# Patient Record
Sex: Female | Born: 1935 | Race: White | Hispanic: No
Health system: Southern US, Community
[De-identification: ages and names within clinical notes are randomized; demographics above are authoritative.]

## PROBLEM LIST (undated history)

## (undated) DIAGNOSIS — E7800 Pure hypercholesterolemia, unspecified: Secondary | ICD-10-CM

## (undated) DIAGNOSIS — I10 Essential (primary) hypertension: Secondary | ICD-10-CM

## (undated) DIAGNOSIS — F0390 Unspecified dementia without behavioral disturbance: Secondary | ICD-10-CM

## (undated) HISTORY — PX: KNEE SURGERY: SHX244

---

## 2004-01-30 ENCOUNTER — Other Ambulatory Visit: Payer: Self-pay

## 2004-02-15 ENCOUNTER — Inpatient Hospital Stay: Payer: Self-pay | Admitting: General Practice

## 2004-02-19 ENCOUNTER — Encounter: Payer: Self-pay | Admitting: Internal Medicine

## 2004-03-01 ENCOUNTER — Encounter: Payer: Self-pay | Admitting: Internal Medicine

## 2004-03-14 ENCOUNTER — Encounter: Payer: Self-pay | Admitting: Internal Medicine

## 2004-11-26 ENCOUNTER — Ambulatory Visit: Payer: Self-pay | Admitting: Internal Medicine

## 2005-11-28 ENCOUNTER — Ambulatory Visit: Payer: Self-pay | Admitting: Internal Medicine

## 2006-06-02 ENCOUNTER — Ambulatory Visit: Payer: Self-pay | Admitting: Internal Medicine

## 2006-12-01 ENCOUNTER — Ambulatory Visit: Payer: Self-pay | Admitting: Internal Medicine

## 2006-12-23 ENCOUNTER — Ambulatory Visit: Payer: Self-pay | Admitting: Orthopedic Surgery

## 2007-04-21 ENCOUNTER — Ambulatory Visit: Payer: Self-pay | Admitting: Internal Medicine

## 2007-05-05 ENCOUNTER — Emergency Department: Payer: Self-pay | Admitting: Emergency Medicine

## 2007-12-03 ENCOUNTER — Ambulatory Visit: Payer: Self-pay | Admitting: Internal Medicine

## 2008-12-13 ENCOUNTER — Ambulatory Visit: Payer: Self-pay | Admitting: Internal Medicine

## 2009-03-17 IMAGING — MG MM CAD SCREENING MAMMO
1 series · 4 of 4 positions shown · non-contrast
Comparison: none

REASON FOR EXAM: scr mammo
COMMENTS:

[Series 795: R CC · right · 4 of 4 slices shown]
[im 1/4]
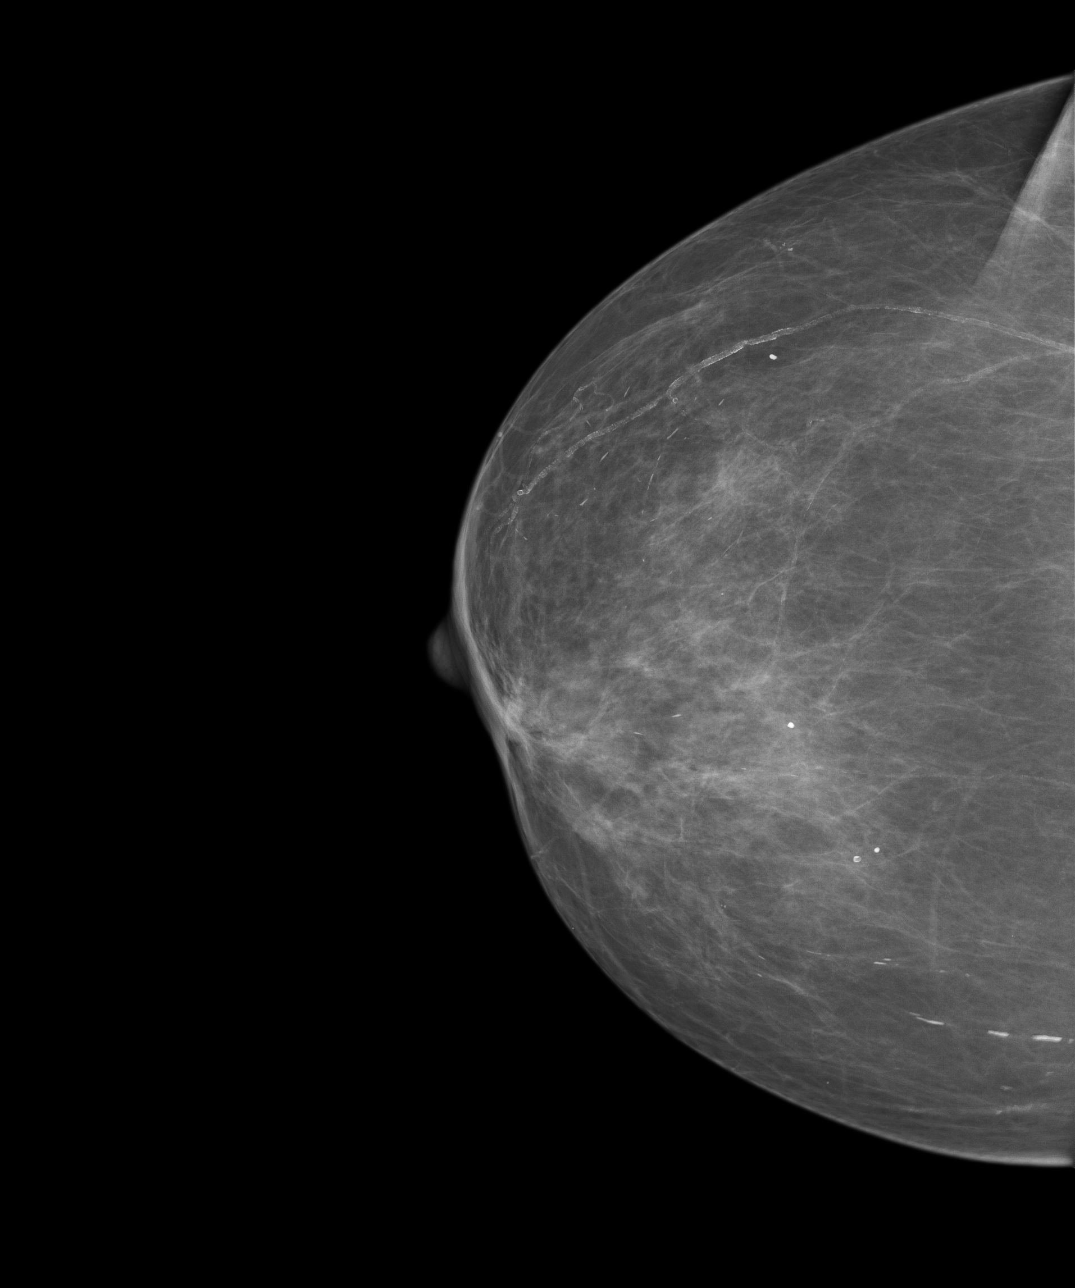
[im 2/4]
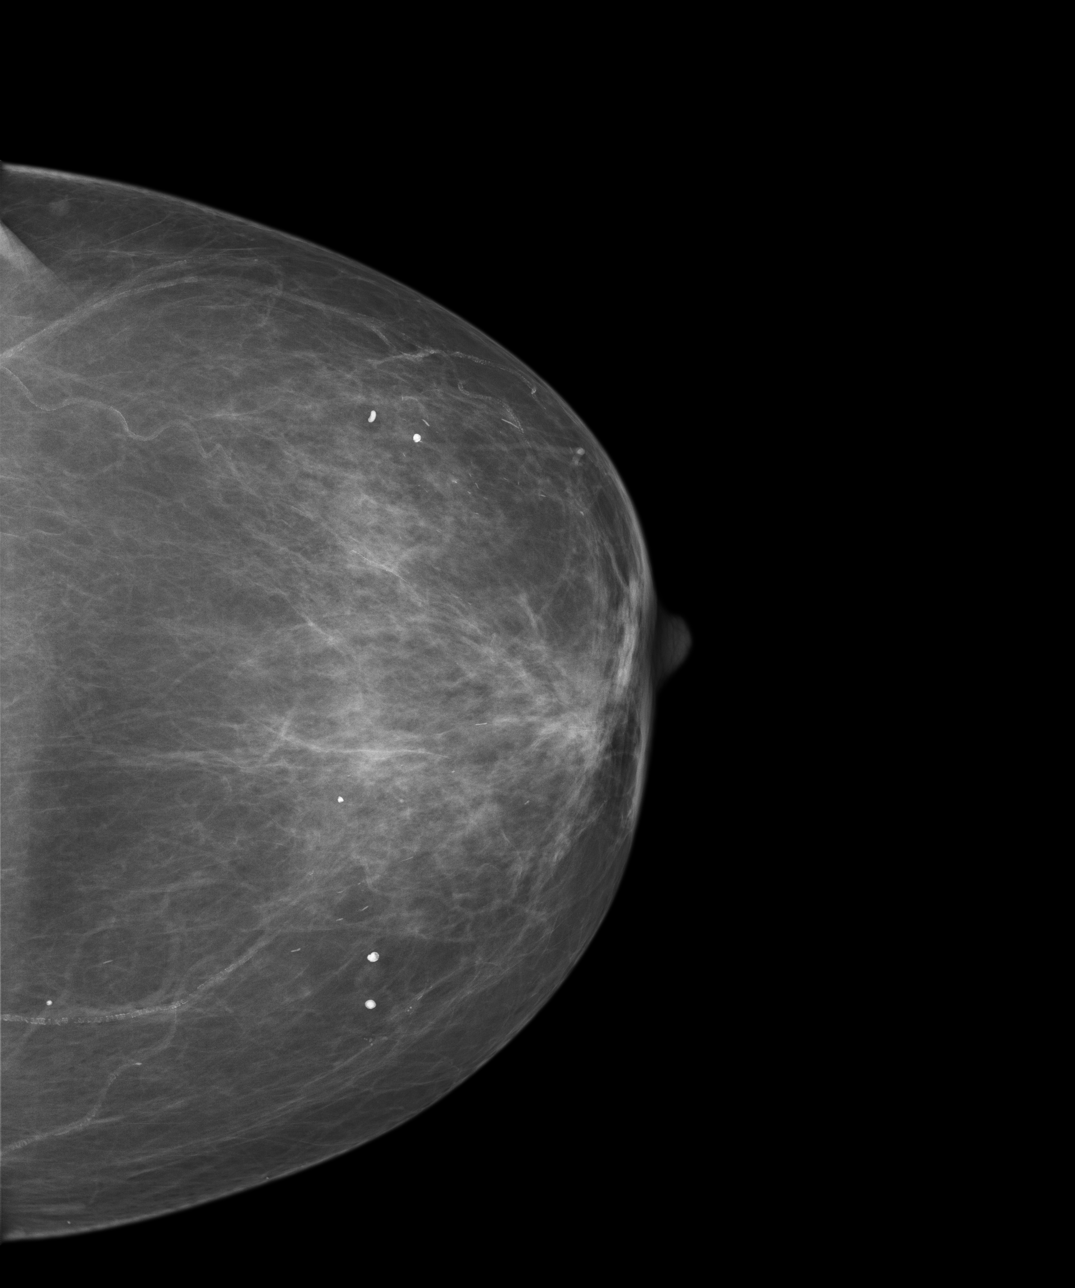
[im 3/4]
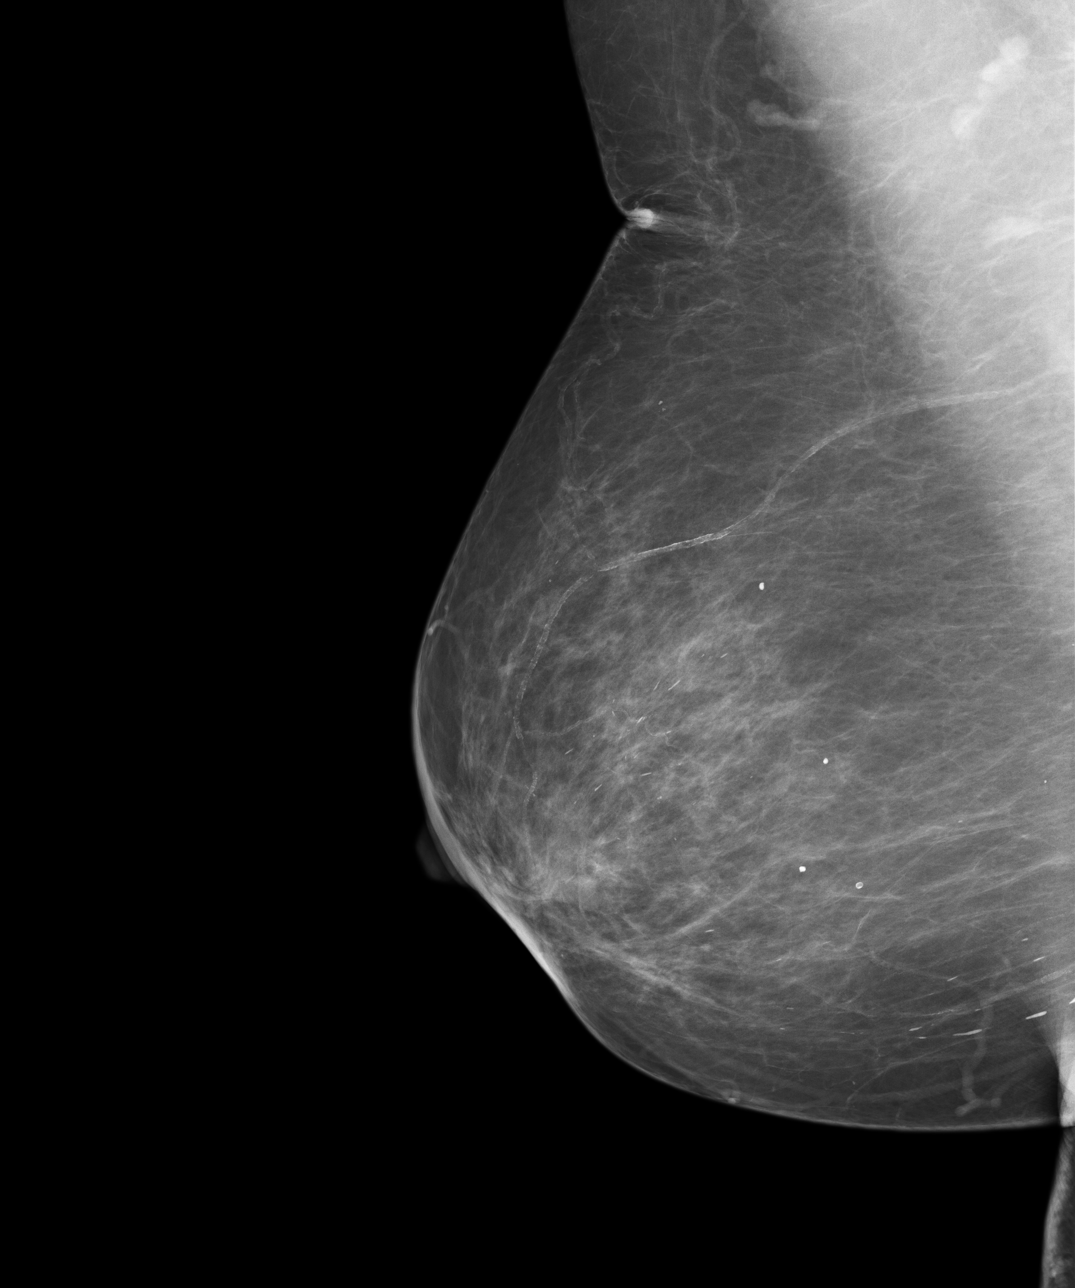
[im 4/4]
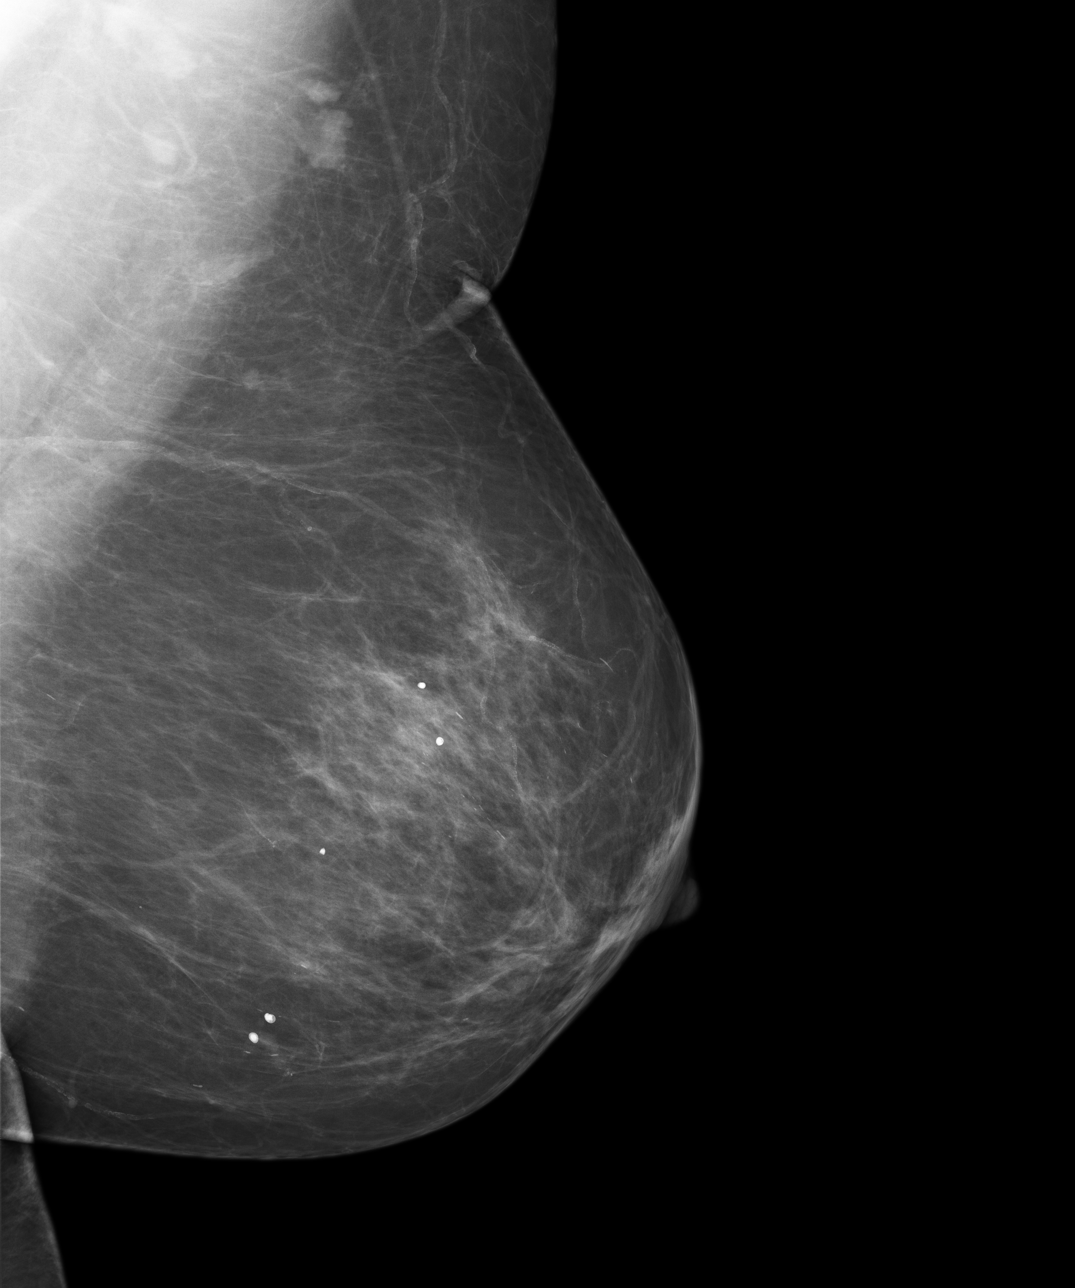

[4 of 4 positions shown; findings below may reference images not displayed]

PROCEDURE:     MAM - MAM DGTL SCREENING MAMMO W/CAD  - December 01, 2006  [DATE]

RESULT:     Comparison is made with film screen images of 10/15/2000 and to
digital images of 11/28/2005, 11/26/2004 and 10/20/2001.  The breasts
exhibit a mildly dense to moderately dense parenchymal pattern with stable
benign-appearing calcifications.  There is no developing density, dominant
mass or malignant appearing calcification present.  The appearance is
stable.
IMPRESSION: Stable, benign-appearing bilateral mammogram.

BI-RADS:  Category 2  Benign Finding.

Please continue to encourage annual mammographic follow up.

A NEGATIVE MAMMOGRAM REPORT DOES NOT PRECLUDE BIOPSY OR OTHER EVALUATION OF
A CLINICALLY PALPABLE OR OTHERWISE SUSPICIOUS MASS OR LESION.  BREAST CANCER
MAY NOT BE DETECTED BY MAMMOGRAPHY IN UP TO 10% OF CASES.

## 2009-12-14 ENCOUNTER — Ambulatory Visit: Payer: Self-pay | Admitting: Internal Medicine

## 2010-12-17 ENCOUNTER — Ambulatory Visit: Payer: Self-pay | Admitting: Internal Medicine

## 2011-12-18 ENCOUNTER — Ambulatory Visit: Payer: Self-pay | Admitting: Internal Medicine

## 2012-12-18 ENCOUNTER — Ambulatory Visit: Payer: Self-pay | Admitting: Internal Medicine

## 2013-07-22 ENCOUNTER — Ambulatory Visit: Payer: Self-pay | Admitting: Podiatry

## 2013-12-21 ENCOUNTER — Ambulatory Visit: Payer: Self-pay | Admitting: Internal Medicine

## 2017-10-15 ENCOUNTER — Emergency Department: Payer: Medicare Other

## 2017-10-15 ENCOUNTER — Encounter: Payer: Self-pay | Admitting: *Deleted

## 2017-10-15 ENCOUNTER — Emergency Department
Admission: EM | Admit: 2017-10-15 | Discharge: 2017-10-16 | Disposition: A | Discharge status: Home / Self Care | Payer: Medicare Other | Attending: Emergency Medicine | Admitting: Emergency Medicine

## 2017-10-15 DIAGNOSIS — I10 Essential (primary) hypertension: Secondary | ICD-10-CM | POA: Diagnosis not present

## 2017-10-15 DIAGNOSIS — S0990XA Unspecified injury of head, initial encounter: Secondary | ICD-10-CM | POA: Diagnosis not present

## 2017-10-15 DIAGNOSIS — Y939 Activity, unspecified: Secondary | ICD-10-CM | POA: Insufficient documentation

## 2017-10-15 DIAGNOSIS — Y92003 Bedroom of unspecified non-institutional (private) residence as the place of occurrence of the external cause: Secondary | ICD-10-CM | POA: Diagnosis not present

## 2017-10-15 DIAGNOSIS — Y999 Unspecified external cause status: Secondary | ICD-10-CM | POA: Diagnosis not present

## 2017-10-15 DIAGNOSIS — F0390 Unspecified dementia without behavioral disturbance: Secondary | ICD-10-CM | POA: Diagnosis not present

## 2017-10-15 DIAGNOSIS — W19XXXA Unspecified fall, initial encounter: Secondary | ICD-10-CM

## 2017-10-15 DIAGNOSIS — W228XXA Striking against or struck by other objects, initial encounter: Secondary | ICD-10-CM | POA: Insufficient documentation

## 2017-10-15 HISTORY — DX: Pure hypercholesterolemia, unspecified: E78.00

## 2017-10-15 HISTORY — DX: Unspecified dementia, unspecified severity, without behavioral disturbance, psychotic disturbance, mood disturbance, and anxiety: F03.90

## 2017-10-15 HISTORY — DX: Essential (primary) hypertension: I10

## 2017-10-15 NOTE — Discharge Instructions (Addendum)
Return to the ER for worsening symptoms, persistent vomiting, difficulty breathing, lethargy or other concerns 

## 2017-10-15 NOTE — ED Provider Notes (Signed)
[facility] Emergency Department Provider Note   ____________________________________________   First MD Initiated Contact with Patient 10/15/17 2309     (approximate)  I have reviewed the triage vital signs and the nursing notes.   HISTORY  Chief Complaint Fall  Level 5 caveat: History limited by dementia Majority of history obtained via patient's family members  HPI Miranda Mckenzie is a 84 y.o. female who presents to the ED from home status post mechanical fall.  Patient has a sitter who stays with her who states patient rolled over off the bed and onto the floor, striking her head on the nightstand.  Patient denies LOC.  Voices no medical complaints.  Takes a baby aspirin daily.  Denies headache, neck pain, vision changes, chest pain, shortness of breath, abdominal pain, nausea or vomiting.   Past Medical History:  Diagnosis Date  . Dementia   . High cholesterol   . Hypertension     There are no active problems to display for this patient.   Past Surgical History:  Procedure Laterality Date  . KNEE SURGERY      Prior to Admission medications   Not on File    Allergies Patient has no allergy information on record.  No family history on file.  Social History Social History   Tobacco Use  . Smoking status: Not on file  Substance Use Topics  . Alcohol use: Not on file  . Drug use: Not on file    Review of Systems  Constitutional: No fever/chills Eyes: No visual changes. ENT: No sore throat. Cardiovascular: Denies chest pain. Respiratory: Denies shortness of breath. Gastrointestinal: No abdominal pain.  No nausea, no vomiting.  No diarrhea.  No constipation. Genitourinary: Negative for dysuria. Musculoskeletal: Negative for back pain. Skin: Negative for rash. Neurological: Positive for minor head injury.  Negative for headaches, focal weakness or numbness.   ____________________________________________   PHYSICAL  EXAM:  VITAL SIGNS: ED Triage Vitals [10/15/17 2244]  Enc Vitals Group     BP (!) 183/83     Pulse Rate 62     Resp 16     Temp 98.9 F (37.2 C)     Temp src      SpO2 96 %     Weight      Height      Head Circumference      Peak Flow      Pain Score      Pain Loc      Pain Edu?      Excl. in GC?     Constitutional: Alert and oriented. Well appearing and in no acute distress. Eyes: Conjunctivae are normal. PERRL. EOMI. Head: Small posterior hematoma without active bleeding or laceration. Nose: No external evidence of injury. Mouth/Throat: Mucous membranes are moist.  No dental malocclusion. Neck: No stridor.  No cervical spine tenderness to palpation. Cardiovascular: Normal rate, regular rhythm. Grossly normal heart sounds.  Good peripheral circulation. Respiratory: Normal respiratory effort.  No retractions. Lungs CTAB. Gastrointestinal: Soft and nontender. No distention. No abdominal bruits. No CVA tenderness. Musculoskeletal: Pelvis stable.  No lower extremity tenderness nor edema.  No joint effusions. Neurologic: Alert and oriented x2 which is baseline for patient per family members.  Normal speech and language. No gross focal neurologic deficits are appreciated. MAEx4. Skin:  Skin is warm, dry and intact. No rash noted. Psychiatric: Mood and affect are normal. Speech and behavior are normal.  ____________________________________________   LABS (all labs ordered are  listed, but only abnormal results are displayed)  Labs Reviewed - No data to display ____________________________________________  EKG  None ____________________________________________  RADIOLOGY  ED MD interpretation: No ICH  Official radiology report(s): Ct Head Wo Contrast  Result Date: 10/15/2017 CLINICAL DATA:  Patient rule out of bed tonight and struck head on the night stand. No loss of consciousness. EXAM: CT HEAD WITHOUT CONTRAST TECHNIQUE: Contiguous axial images were obtained from the  base of the skull through the vertex without intravenous contrast. COMPARISON:  None. FINDINGS: Brain: Diffuse cerebral atrophy. Mild ventricular dilatation consistent with central atrophy. Low-attenuation changes in the deep white matter consistent with small vessel ischemia. No mass-effect or midline shift. No abnormal extra-axial fluid collections. Gray-white matter junctions are distinct. Basal cisterns are not effaced. No acute intracranial hemorrhage. Vascular: Moderate intracranial arterial vascular calcifications are present. Dilated and tortuous basilar artery. Skull: Calvarium appears intact. No acute depressed skull fractures. Sinuses/Orbits: Paranasal sinuses and mastoid air cells are clear. Mastoids are hypoaerated. Other: Suggestion of a small subcutaneous scalp laceration over the right anterior frontal region. IMPRESSION: No acute intracranial abnormalities. Chronic atrophy and small vessel ischemic changes. Electronically Signed   By: Burman Nieves M.D.   On: 10/15/2017 23:52    ____________________________________________   PROCEDURES  Procedure(s) performed: None  Procedures  Critical Care performed: No  ____________________________________________   INITIAL IMPRESSION / ASSESSMENT AND PLAN / ED COURSE  As part of my medical decision making, I reviewed the following data within the electronic MEDICAL RECORD NUMBER History obtained from family, Nursing notes reviewed and incorporated, Old chart reviewed, Radiograph reviewed and Notes from prior ED visits   84 year old female who presents status post mechanical fall with minor head injury.  Differential diagnosis includes but is not limited to ICH, SDH, cerebral contusion, concussion, etc.  Patient is sitting in no acute distress visiting with family members.  Will obtain CT head to evaluate for intracranial injury.  Clinical Course as of Oct 16 2354  Wed Oct 15, 2017  2356 Updated patient and family members of negative CT  head.  Strict return precautions given.  All verbalize understanding and agree with plan of care.   [JS]    Clinical Course User Index [JS] Irean Hong, MD     ____________________________________________   FINAL CLINICAL IMPRESSION(S) / ED DIAGNOSES  Final diagnoses:  Fall, initial encounter  Minor head injury, initial encounter     ED Discharge Orders    None       Note:  This document was prepared using Dragon voice recognition software and may include unintentional dictation errors.    Irean Hong, MD 10/16/17 [phone]

## 2017-10-15 NOTE — ED Notes (Addendum)
Resumed care from [facility]  Pt alert. Pt rolled out of bed tonight and hit her head on  the nightstand.  No loc. No vomiting. Speech clear.  Denies neck or back pain.

## 2017-10-15 NOTE — ED Triage Notes (Signed)
Pt thinks she may have rolled over and off the bed, possibly striking her head on the nightstand as a piece of wood is missing from the corner of the nightstand. She takes ASA everyday. The pt does have dementia and had a sitter staying with her and she heard a thump, she went in the room and the pt was sitting on the floor. Hematoma and abrasion to the back of the head.

## 2019-03-03 ENCOUNTER — Ambulatory Visit: Payer: Medicare Other | Attending: Internal Medicine

## 2019-03-03 DIAGNOSIS — Z23 Encounter for immunization: Secondary | ICD-10-CM | POA: Insufficient documentation

## 2019-03-03 NOTE — Progress Notes (Signed)
   Covid-19 Vaccination Clinic  Name:  Miranda Mckenzie    MRN: [id number] DOB: [birth year]  03/03/2019  [name] was observed post Covid-19 immunization for 15 minutes without incidence. She was provided with Vaccine Information Sheet and instruction to access the V-Safe system.   [name] was instructed to call 911 with any severe reactions post vaccine: Marland Kitchen Difficulty breathing  . Swelling of your face and throat  . A fast heartbeat  . A bad rash all over your body  . Dizziness and weakness    Immunizations Administered    Name Date Dose VIS Date Route   Pfizer COVID-19 Vaccine 03/03/2019  5:14 PM 0.3 mL 01/22/2019 Intramuscular   Manufacturer: [employer], [employer]   Lot: [id number]   NDC: [address]-9

## 2019-03-22 ENCOUNTER — Ambulatory Visit: Payer: Medicare Other | Attending: Internal Medicine

## 2019-03-22 DIAGNOSIS — Z23 Encounter for immunization: Secondary | ICD-10-CM

## 2019-03-22 NOTE — Progress Notes (Signed)
   Covid-19 Vaccination Clinic  Name:  Miranda Mckenzie    MRN: [id number] DOB: 1922-03-24  03/22/2019  [name] was observed post Covid-19 immunization for 15 minutes without incidence. She was provided with Vaccine Information Sheet and instruction to access the V-Safe system.   [name] was instructed to call 911 with any severe reactions post vaccine: Marland Kitchen Difficulty breathing  . Swelling of your face and throat  . A fast heartbeat  . A bad rash all over your body  . Dizziness and weakness    Immunizations Administered    Name Date Dose VIS Date Route   Pfizer COVID-19 Vaccine 03/22/2019 11:23 AM 0.3 mL 01/22/2019 Intramuscular   Manufacturer: [employer], [employer]   Lot: [id number]   NDC: [address]-4

## 2020-01-28 ENCOUNTER — Ambulatory Visit: Payer: Medicare Other | Attending: Internal Medicine

## 2020-01-28 ENCOUNTER — Other Ambulatory Visit: Payer: Self-pay | Admitting: Internal Medicine

## 2020-01-28 DIAGNOSIS — Z23 Encounter for immunization: Secondary | ICD-10-CM

## 2020-01-28 NOTE — Progress Notes (Signed)
   Covid-19 Vaccination Clinic  Name:  Miranda Mckenzie    MRN: [id number] DOB: 02/04/1923  01/28/2020  Miranda Mckenzie was observed post Covid-19 immunization for 15 minutes without incident. She was provided with Vaccine Information Sheet and instruction to access the V-Safe system.   Miranda Mckenzie was instructed to call 911 with any severe reactions post vaccine: Marland Kitchen Difficulty breathing  . Swelling of face and throat  . A fast heartbeat  . A bad rash all over body  . Dizziness and weakness   Immunizations Administered    Name Date Dose VIS Date Route   Pfizer COVID-19 Vaccine 01/28/2020 10:09 AM 0.3 mL 12/01/2019 Intramuscular   Manufacturer: [employer], [employer]   Lot: [id number]   NDC: [address]-7

## 2020-03-31 ENCOUNTER — Other Ambulatory Visit: Payer: Self-pay | Admitting: Internal Medicine

## 2020-03-31 DIAGNOSIS — R400 Somnolence: Secondary | ICD-10-CM

## 2020-03-31 DIAGNOSIS — R4182 Altered mental status, unspecified: Secondary | ICD-10-CM

## 2020-04-12 ENCOUNTER — Other Ambulatory Visit: Payer: Self-pay

## 2020-04-12 ENCOUNTER — Ambulatory Visit
Admission: RE | Admit: 2020-04-12 | Discharge: 2020-04-12 | Disposition: A | Discharge status: Home / Self Care | Payer: Medicare Other | Source: Ambulatory Visit | Attending: Internal Medicine | Admitting: Internal Medicine

## 2020-04-12 DIAGNOSIS — R4182 Altered mental status, unspecified: Secondary | ICD-10-CM | POA: Insufficient documentation

## 2020-04-12 DIAGNOSIS — R400 Somnolence: Secondary | ICD-10-CM | POA: Diagnosis present

## 2020-08-18 ENCOUNTER — Ambulatory Visit: Payer: Medicare Other

## 2020-10-06 ENCOUNTER — Emergency Department
Admission: EM | Admit: 2020-10-06 | Discharge: 2020-10-12 | Disposition: E | Payer: Medicare Other | Attending: Emergency Medicine | Admitting: Emergency Medicine

## 2020-10-06 ENCOUNTER — Emergency Department: Payer: Medicare Other

## 2020-10-06 DIAGNOSIS — R092 Respiratory arrest: Secondary | ICD-10-CM | POA: Diagnosis present

## 2020-10-06 DIAGNOSIS — I10 Essential (primary) hypertension: Secondary | ICD-10-CM | POA: Insufficient documentation

## 2020-10-06 DIAGNOSIS — Z20822 Contact with and (suspected) exposure to covid-19: Secondary | ICD-10-CM | POA: Diagnosis not present

## 2020-10-06 DIAGNOSIS — R790 Abnormal level of blood mineral: Secondary | ICD-10-CM | POA: Diagnosis not present

## 2020-10-06 DIAGNOSIS — Z79899 Other long term (current) drug therapy: Secondary | ICD-10-CM | POA: Diagnosis not present

## 2020-10-06 DIAGNOSIS — F0390 Unspecified dementia without behavioral disturbance: Secondary | ICD-10-CM | POA: Diagnosis not present

## 2020-10-06 DIAGNOSIS — R0689 Other abnormalities of breathing: Secondary | ICD-10-CM | POA: Insufficient documentation

## 2020-10-06 DIAGNOSIS — I469 Cardiac arrest, cause unspecified: Secondary | ICD-10-CM

## 2020-10-06 LAB — CBC WITH DIFFERENTIAL/PLATELET
Abs Immature Granulocytes: 0.38 10*3/uL — ABNORMAL HIGH (ref 0.00–0.07)
Basophils Absolute: 0 10*3/uL (ref 0.0–0.1)
Basophils Relative: 0 %
Eosinophils Absolute: 0.1 10*3/uL (ref 0.0–0.5)
Eosinophils Relative: 1 %
HCT: 41.7 % (ref 36.0–46.0)
Hemoglobin: 12.2 g/dL (ref 12.0–15.0)
Immature Granulocytes: 5 %
Lymphocytes Relative: 50 %
Lymphs Abs: 4.3 10*3/uL — ABNORMAL HIGH (ref 0.7–4.0)
MCH: 28.6 pg (ref 26.0–34.0)
MCHC: 29.3 g/dL — ABNORMAL LOW (ref 30.0–36.0)
MCV: 97.9 fL (ref 80.0–100.0)
Monocytes Absolute: 0.4 10*3/uL (ref 0.1–1.0)
Monocytes Relative: 4 %
Neutro Abs: 3.4 10*3/uL (ref 1.7–7.7)
Neutrophils Relative %: 40 %
Platelets: 187 10*3/uL (ref 150–400)
RBC: 4.26 MIL/uL (ref 3.87–5.11)
RDW: 14.4 % (ref 11.5–15.5)
WBC: 8.5 10*3/uL (ref 4.0–10.5)
nRBC: 0 % (ref 0.0–0.2)

## 2020-10-06 LAB — BRAIN NATRIURETIC PEPTIDE: B Natriuretic Peptide: 258.8 pg/mL — ABNORMAL HIGH (ref 0.0–100.0)

## 2020-10-06 LAB — URINALYSIS, COMPLETE (UACMP) WITH MICROSCOPIC
Bilirubin Urine: NEGATIVE
Glucose, UA: NEGATIVE mg/dL
Hgb urine dipstick: NEGATIVE
Ketones, ur: NEGATIVE mg/dL
Leukocytes,Ua: NEGATIVE
Nitrite: NEGATIVE
Protein, ur: NEGATIVE mg/dL
Specific Gravity, Urine: 1.017 (ref 1.005–1.030)
pH: 5 (ref 5.0–8.0)

## 2020-10-06 LAB — COMPREHENSIVE METABOLIC PANEL
ALT: 21 U/L (ref 0–44)
AST: 41 U/L (ref 15–41)
Albumin: 2.8 g/dL — ABNORMAL LOW (ref 3.5–5.0)
Alkaline Phosphatase: 70 U/L (ref 38–126)
Anion gap: 10 (ref 5–15)
BUN: 15 mg/dL (ref 8–23)
CO2: 30 mmol/L (ref 22–32)
Calcium: 9.1 mg/dL (ref 8.9–10.3)
Chloride: 97 mmol/L — ABNORMAL LOW (ref 98–111)
Creatinine, Ser: 1.02 mg/dL — ABNORMAL HIGH (ref 0.44–1.00)
GFR, Estimated: 51 mL/min — ABNORMAL LOW (ref >60)
Glucose, Bld: 264 mg/dL — ABNORMAL HIGH (ref 70–99)
Potassium: 5.3 mmol/L — ABNORMAL HIGH (ref 3.5–5.1)
Sodium: 137 mmol/L (ref 135–145)
Total Bilirubin: 0.6 mg/dL (ref 0.3–1.2)
Total Protein: 5.9 g/dL — ABNORMAL LOW (ref 6.5–8.1)

## 2020-10-06 LAB — URINE DRUG SCREEN, QUALITATIVE (ARMC ONLY)
Amphetamines, Ur Screen: NOT DETECTED
Barbiturates, Ur Screen: NOT DETECTED
Benzodiazepine, Ur Scrn: NOT DETECTED
Cannabinoid 50 Ng, Ur ~~LOC~~: NOT DETECTED
Cocaine Metabolite,Ur ~~LOC~~: NOT DETECTED
MDMA (Ecstasy)Ur Screen: NOT DETECTED
Methadone Scn, Ur: NOT DETECTED
Opiate, Ur Screen: NOT DETECTED
Phencyclidine (PCP) Ur S: NOT DETECTED
Tricyclic, Ur Screen: NOT DETECTED

## 2020-10-06 LAB — TROPONIN I (HIGH SENSITIVITY): Troponin I (High Sensitivity): 33 ng/L — ABNORMAL HIGH (ref <18)

## 2020-10-06 LAB — RESP PANEL BY RT-PCR (FLU A&B, COVID) ARPGX2
Influenza A by PCR: NEGATIVE
Influenza B by PCR: NEGATIVE
SARS Coronavirus 2 by RT PCR: NEGATIVE

## 2020-10-06 LAB — LACTIC ACID, PLASMA: Lactic Acid, Venous: 7.2 mmol/L (ref 0.5–1.9)

## 2020-10-06 MED ORDER — FENTANYL CITRATE PF 50 MCG/ML IJ SOSY
50.0000 ug | PREFILLED_SYRINGE | INTRAMUSCULAR | Status: DC | PRN
  Administered 2020-10-06: 50 ug via INTRAVENOUS
  Filled 2020-10-06: qty 1

## 2020-10-06 MED ORDER — ONDANSETRON 4 MG PO TBDP
4.0000 mg | ORAL_TABLET | Freq: Four times a day (QID) | ORAL | Status: DC | PRN
Start: 2020-10-06 — End: 2020-10-06

## 2020-10-06 MED ORDER — MIDAZOLAM HCL 2 MG/2ML IJ SOLN
2.0000 mg | Freq: Once | INTRAMUSCULAR | Status: AC
  Administered 2020-10-06: 2 mg via INTRAVENOUS

## 2020-10-06 MED ORDER — EPINEPHRINE PF 1 MG/ML IJ SOLN
1.0000 mg | Freq: Once | INTRAMUSCULAR | Status: AC
  Administered 2020-10-06: 1 mg via INTRAVENOUS

## 2020-10-06 MED ORDER — MIDAZOLAM HCL 2 MG/2ML IJ SOLN
2.0000 mg | INTRAMUSCULAR | Status: DC | PRN
  Administered 2020-10-06: 2 mg via INTRAVENOUS

## 2020-10-06 MED ORDER — LORAZEPAM 2 MG/ML IJ SOLN: 2.0000 mg | INTRAMUSCULAR | Status: DC | PRN

## 2020-10-06 MED ORDER — ONDANSETRON HCL 4 MG/2ML IJ SOLN
4.0000 mg | Freq: Four times a day (QID) | INTRAMUSCULAR | Status: DC | PRN
  Administered 2020-10-06: 4 mg via INTRAVENOUS
  Filled 2020-10-06: qty 2

## 2020-10-06 MED ORDER — MIDAZOLAM-SODIUM CHLORIDE 100-0.9 MG/100ML-% IV SOLN
0.5000 mg/h | INTRAVENOUS | Status: DC
  Administered 2020-10-06: 0.5 mg/h via INTRAVENOUS
  Filled 2020-10-06: qty 100

## 2020-10-06 MED ORDER — CALCIUM CHLORIDE 10 % IV SOLN
INTRAVENOUS | Status: AC | PRN
Start: 2020-10-06 — End: 2020-10-06
  Administered 2020-10-06: 1 g via INTRAVENOUS

## 2020-10-06 MED ORDER — SODIUM CHLORIDE 0.9 % IV SOLN
INTRAVENOUS | Status: AC | PRN
  Administered 2020-10-06: 1000 mL via INTRAVENOUS

## 2020-10-06 NOTE — Code Documentation (Signed)
Miranda Mckenzie and Miranda Mckenzie at bedside

## 2020-10-06 NOTE — Code Documentation (Signed)
Family at beside. Family given emotional support. 

## 2020-10-06 NOTE — ED Notes (Signed)
Respiratory to bedside.

## 2020-10-06 NOTE — Progress Notes (Signed)
Patient terminally extubated per MD order. MD, RN and Family at bedside.

## 2020-10-06 NOTE — Code Documentation (Signed)
ED md left room to speak with family

## 2020-10-06 NOTE — ED Notes (Signed)
MD remains at bedside, discussing with pt's friends plan of care.

## 2020-10-06 NOTE — ED Notes (Signed)
Critical Lactic Acid 7.2 Dr. Marisa Severin notified.

## 2020-10-06 NOTE — Code Documentation (Signed)
Rad at bedside.

## 2020-10-06 NOTE — ED Notes (Signed)
This RN, Consulting civil engineer, Dr. Marisa Severin and RT at  Bedside for extubation. Pt's friends remain at bedside. NAD.

## 2020-10-06 NOTE — ED Notes (Signed)
ED Provider at bedside. 

## 2020-10-06 NOTE — Code Documentation (Signed)
Cut white t shirt, yellow ring with clear and blue stone, blue shoes, cut jeans in pt. Belonging bag. Will be sent home with family.

## 2020-10-06 NOTE — Code Documentation (Signed)
ED MD returns to room, states pt's family does not wish for further resucitaion/CPR measures. Will continue to monitor pt.

## 2020-10-06 NOTE — ED Notes (Signed)
ED Provider at bedside. Extensive conversation on end of life decisions. Pt's friends and healthcare power of attorny verbalized to MD that they would like pt. terminally extubated, with medications control pain. MD discussed pklan of care going forward

## 2020-10-06 NOTE — ED Notes (Signed)
Pt's friends verbalize desire to stay at bedside for terminal extubation.

## 2020-10-06 NOTE — ED Notes (Signed)
ED Provider at bedside. Updated pt's friends on POC.

## 2020-10-06 NOTE — ED Notes (Signed)
ED Provider at bedside, counseling family through pt's last moments, and reassuring family.

## 2020-10-06 NOTE — ED Notes (Signed)
MD notified pt's BP steadily trending downward.

## 2020-10-06 NOTE — ED Provider Notes (Signed)
[facility] Emergency Department Provider Note ____________________________________________   Event Date/Time   First MD Initiated Contact with Patient 10-28-2020 2054     (approximate)  I have reviewed the triage vital signs and the nursing notes.   HISTORY  Chief Complaint Cardiac Arrest (Pt. To ED vias EMS with CPR in progress. Medics report witnessed cardiac arrest at residence, reports asystole on scene, CPR initiated, 2 rounds of epi given, which lead to ROSC, with positive pulse. Medic reports pt. Was pulseless several minutes prior to arrival to ED, and CPR was initiated again.. EMS reports no history other than HTN, and states pt. Has been down a total of 25 mins upon arrival.)  Level 5 caveat: History of present illness limited due to cardiac arrest, unresponsive  HPI Miranda Mckenzie is a 87 y.o. female with PMH as noted below who presents after cardiac arrest.  Per EMS, the patient had a witnessed arrest when she was with friends and suddenly collapsed.  She was in asystole when they arrived.  CPR was initiated and the patient received two doses of epinephrine and then had ROSC.  Pulses were lost again a few minutes from the ED and CPR was initiated again.    Past Medical History:  Diagnosis Date   Dementia (HCC)    High cholesterol    Hypertension     There are no problems to display for this patient.   Past Surgical History:  Procedure Laterality Date   KNEE SURGERY      Prior to Admission medications   Medication Sig Start Date End Date Taking? Authorizing Provider  COVID-19 mRNA vaccine, Pfizer, 30 MCG/0.3ML injection USE AS DIRECTED 01/28/20 01/27/21  Judyann Munson, MD    Allergies Patient has no allergy information on record.  No family history on file.  Social History    Review of Systems Level 5 caveat: unable to obtain review of systems due to cardiac arrest,  unresponsive   ____________________________________________   PHYSICAL EXAM:  VITAL SIGNS: ED Triage Vitals  Enc Vitals Group     BP Oct 28, 2020 2029 (!) 175/95     Pulse Rate 28-Oct-2020 2021 (!) 107     Resp 2020-10-28 2024 (!) 8     Temp 10-28-20 2045 (!) 96.3 F (35.7 C)     Temp Source 2020/10/28 2045 Bladder     SpO2 Oct 28, 2020 2029 100 %     Weight --      Height --      Head Circumference --      Peak Flow --      Pain Score --      Pain Loc --      Pain Edu? --      Excl. in GC? --     Constitutional: Unresponsive Eyes: Pupils 68mm and fixed.  Head: Atraumatic. Nose: No congestion/rhinnorhea. Mouth/Throat: Mucous membranes are moist.   Neck: Normal range of motion.  Cardiovascular: Cardiac arrest, no pulse Respiratory: No spontaneous respirations. Gastrointestinal: No distention.  Genitourinary: Normal external genitalia Musculoskeletal: No lower extremity edema.   Neurologic:  Unresponsive Skin:  Skin is warm and dry. No rash noted. Psychiatric: Unresponsive  ____________________________________________   LABS (all labs ordered are listed, but only abnormal results are displayed)  Labs Reviewed  COMPREHENSIVE METABOLIC PANEL - Abnormal; Notable for the following components:      Result Value   Potassium 5.3 (*)    Chloride 97 (*)    Glucose, Bld 264 (*)  Creatinine, Ser 1.02 (*)    Total Protein 5.9 (*)    Albumin 2.8 (*)    GFR, Estimated 51 (*)    All other components within normal limits  CBC WITH DIFFERENTIAL/PLATELET - Abnormal; Notable for the following components:   MCHC 29.3 (*)    Lymphs Abs 4.3 (*)    Abs Immature Granulocytes 0.38 (*)    All other components within normal limits  LACTIC ACID, PLASMA - Abnormal; Notable for the following components:   Lactic Acid, Venous 7.2 (*)    All other components within normal limits  BRAIN NATRIURETIC PEPTIDE - Abnormal; Notable for the following components:   B Natriuretic Peptide 258.8 (*)    All  other components within normal limits  URINALYSIS, COMPLETE (UACMP) WITH MICROSCOPIC - Abnormal; Notable for the following components:   Color, Urine YELLOW (*)    APPearance HAZY (*)    Bacteria, UA RARE (*)    All other components within normal limits  TROPONIN I (HIGH SENSITIVITY) - Abnormal; Notable for the following components:   Troponin I (High Sensitivity) 33 (*)    All other components within normal limits  RESP PANEL BY RT-PCR (FLU A&B, COVID) ARPGX2  URINE DRUG SCREEN, QUALITATIVE ([facility] ONLY)  LACTIC ACID, PLASMA  TROPONIN I (HIGH SENSITIVITY)   ____________________________________________  EKG  ED ECG REPORT I, Dionne Bucy, the attending physician, personally viewed and interpreted this ECG.  Date: 09/19/2020 EKG Time: 2035 Rate: 52 Rhythm: sinus rhythm with PACs QRS Axis: normal Intervals: normal ST/T Wave abnormalities: normal Narrative Interpretation: no evidence of acute ischemia  ____________________________________________  RADIOLOGY  Chest X-ray interpreted by me shows faint right lung opacities; ET tube 4cm above carina  ____________________________________________   PROCEDURES  Procedure(s) performed: Yes  Procedure Name: Intubation Date/Time:  12:14 AM Performed by: Dionne Bucy, MD Pre-anesthesia Checklist: Patient identified Oxygen Delivery Method: Ambu bag Preoxygenation: Pre-oxygenation with 100% oxygen Induction Type: Rapid sequence Laryngoscope Size: Glidescope and 3 Tube size: 7.5 mm Number of attempts: 1 Secured at: 22 cm Tube secured with: ETT holder     Critical Care performed: Yes  CRITICAL CARE Performed by: Dionne Bucy   Total critical care time: 75 minutes  Critical care time was exclusive of separately billable procedures and treating other patients.  Critical care was necessary to treat or prevent imminent or life-threatening deterioration.  Critical care was time spent personally  by me on the following activities: development of treatment plan with patient and/or surrogate as well as nursing, discussions with consultants, evaluation of patient's response to treatment, examination of patient, obtaining history from patient or surrogate, ordering and performing treatments and interventions, ordering and review of laboratory studies, ordering and review of radiographic studies, pulse oximetry and re-evaluation of patient's condition.  ____________________________________________   INITIAL IMPRESSION / ASSESSMENT AND PLAN / ED COURSE  Pertinent labs & imaging results that were available during my care of the patient were reviewed by me and considered in my medical decision making (see chart for details).   87 year old female with PMH as noted above, overall very healthy for her age presents in cardiac arrest after collapsing while with friends.  She was in asystole with EMS, had ROSC after epinephrine x2, then lost pulses and arrived in ED with CPR in progress.  EMS gave me a legal form which was a Engineer, materials of a health care proxy to make decisions if the patient was incapacitated but did not specify DNR or other advanced directives.  On ED arrival, CPR was continued.  The patient had no pulse on initial check.  Additional epinephrine was given, the patient was intubated successfully on the first attempt, and then had ROSC again.  After second ROSC, the patient remained somewhat hypotensive.  She was completely unresponsive and did not react to intubation or the mechanical ventilation.    Shortly afterwards the patient's friend and POA, Aurea Graff, arrived.  I talked to her extensively.  She advised that the patient had made clear she would not want to be "a vegetable" or be on mechanical ventilation if she did not have a prognosis of recovery.  I explained the dire prognosis given the recurrent arrest and lack of any significant neurologic response.  Based on shared decision  making with the POA, we decided to proceed with initial workup to determine if there were any reversible causes.    Overall presentation is most consistent with primary cardiac etiology; we will obtain labs, X-ray, give fluids and reassess.   ----------------------------------------- 10:17 PM on 10-24-2020 -----------------------------------------  Initial workup is unremarkable except for a finding of a lung opacity; the patient was recently treated for pneumonia so I doubt this is acute.  There is mild hyperkalemia but calcium was already given.    Overall there are no specific findings to suggest a reversible cause of the patient's condition.  I doubt primary CNS cause given that I would expect the patient to have respiratory arrest initially rather than asystole.    The patient continues to have no significant response; her eyes occasionally will twitch but she is not breathing spontaneously or responding despite not being on sedation.  I started a versed infusion because of the twitching to avoid any possible discomfort.  She is also increasingly hypertensive and the core temperature is dropping.    I had an extensive discussion with Aurea Graff about the goals of care.  She again emphasizes that the patient would not have wanted to be on a ventilator for any prolonged amount of time.    I advised that given the very poor prognosis, we could continue to watch the patient and wait, versus considering withdrawing ventilatory support.  Based on the poor prognosis and goals of care, the POA and I agreed that we would not initiate pressors or other aggressive measures.    ----------------------------------------- 10:45 PM on 24-Oct-2020 -----------------------------------------   The patient's blood pressure has improved slightly but she remains unresponsive with no spontaneous respirations or bucking of the vent.  I do not expect any meaningful neurologic recovery even with maximal efforts.  I again  had an extensive discussion with the POA to discuss options which remain the same as above.  The POA has decided to withdraw ventilatory support and proceed with comfort care.    ----------------------------------------- 11:43 PM on October 24, 2020 -----------------------------------------  The patient was given fentanyl and additional versed; the ET tube was removed shortly after 11PM.  The patient remained apneic although then had shallow agonal respirations for a few minutes.  She expired at 11:40 with POA at bedside.    ____________________________________________   FINAL CLINICAL IMPRESSION(S) / ED DIAGNOSES  Final diagnoses:  Cardiac arrest (HCC)  Respiratory arrest (HCC)      NEW MEDICATIONS STARTED DURING THIS VISIT:  New Prescriptions   No medications on file     Note:  This document was prepared using Dragon voice recognition software and may include unintentional dictation errors.    Dionne Bucy, MD 09/18/2020 [phone]

## 2020-10-12 NOTE — ED Notes (Signed)
Pt's POA given pt's valuables, Miranda Mckenzie. Pt's POA refused pt's clothes, stated she did not want them, pt's POA givven pt's ring, pt's ring noted to be shiny and yellow with blue and clear stones.

## 2020-10-12 DEATH — deceased

## 2021-02-20 ENCOUNTER — Other Ambulatory Visit (HOSPITAL_COMMUNITY): Payer: Self-pay

## 2022-07-28 IMAGING — CT CT HEAD W/O CM
3 series · 15 of 46 positions shown, 18 images · non-contrast
Comparison: Head CT 10/15/2017

CLINICAL DATA: [AGE] with altered mental status.  Somnolence.

EXAM:
CT HEAD WITHOUT CONTRAST
TECHNIQUE: Contiguous axial images were obtained from the base of the skull
through the vertex without intravenous contrast.

[Series 3: head wo · axial · 0.43mm/px · z∈[-117,+3]mm · 9 of 29 slices shown, 12 images]
[im 3/29  brain]
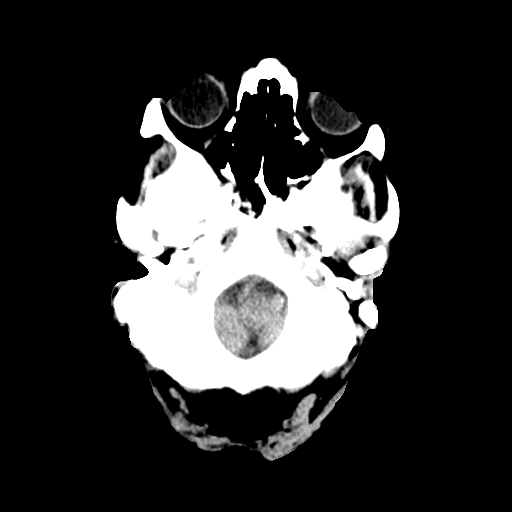
[im 3/29  bone]
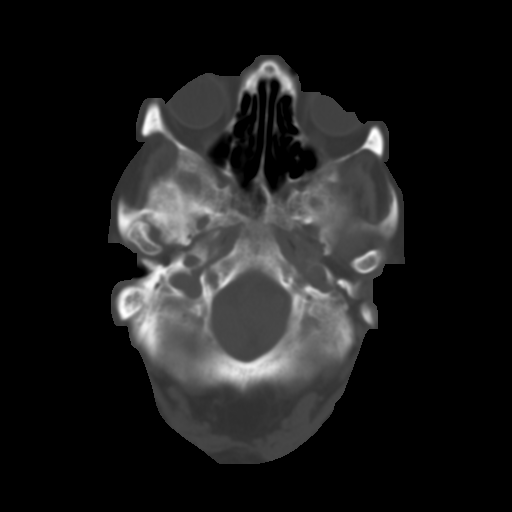
[im 6/29  brain]
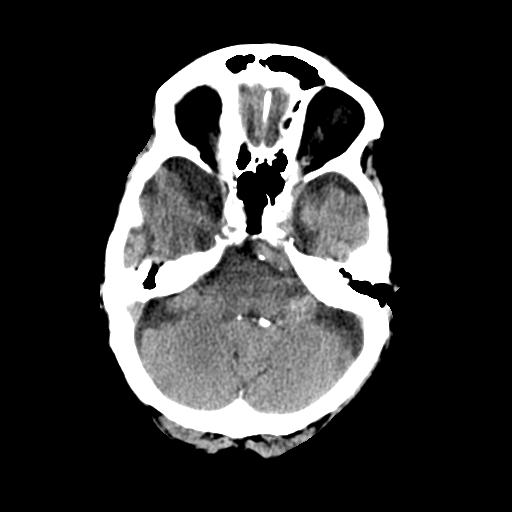
[im 9/29  brain]
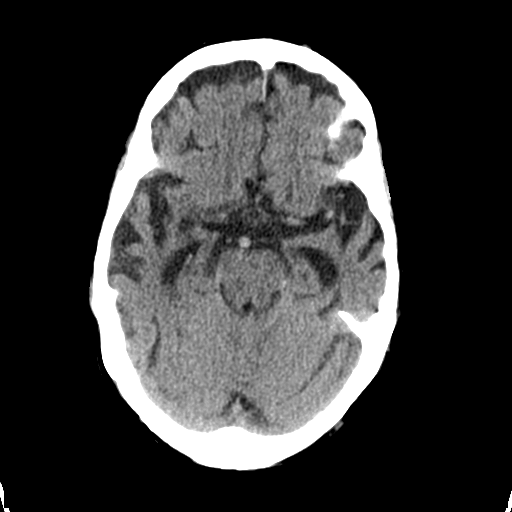
[im 12/29  brain]
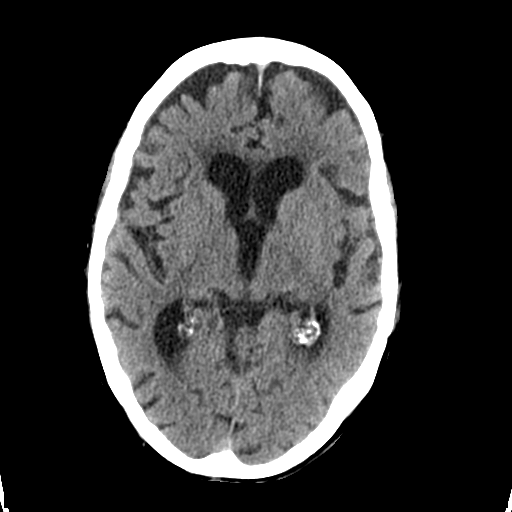
[im 15/29  brain]
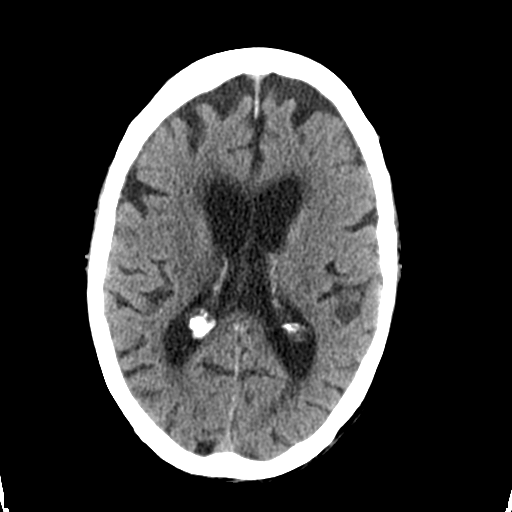
[im 15/29  bone]
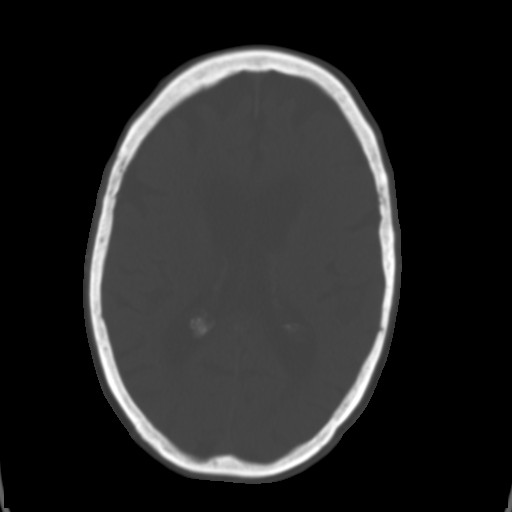
[im 18/29  brain]
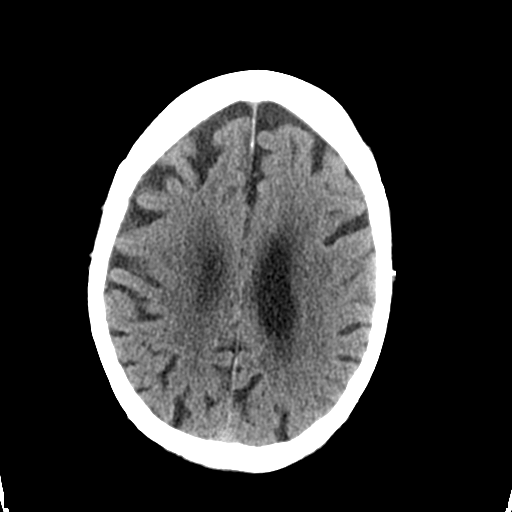
[im 21/29  brain]
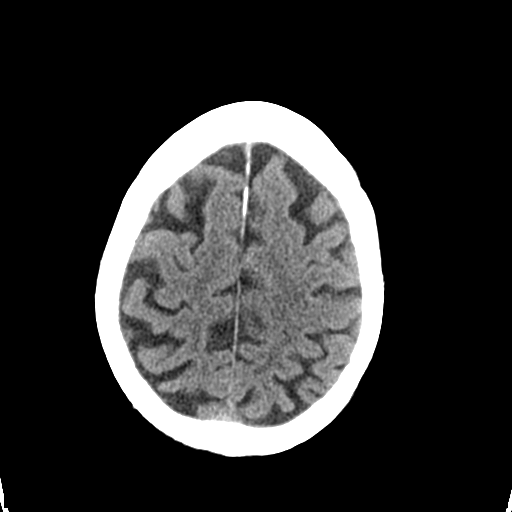
[im 24/29  brain]
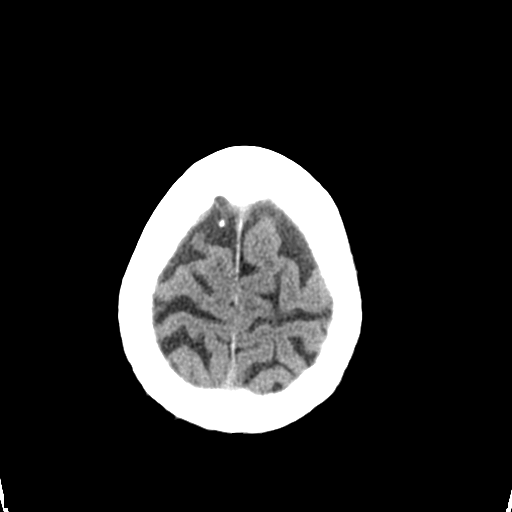
[im 27/29  brain]
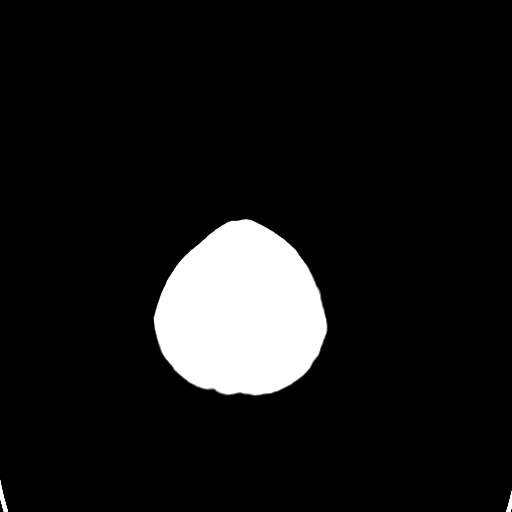
[im 27/29  bone]
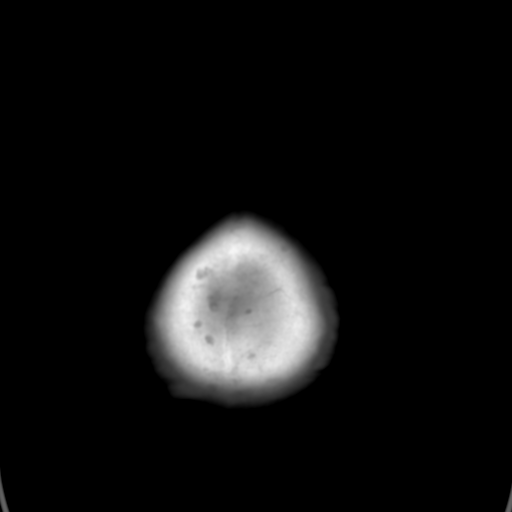

[Series 4: coronal soft tissue · coronal · 0.32mm/px · 3 of 65 slices shown]
[im 22/65  brain]
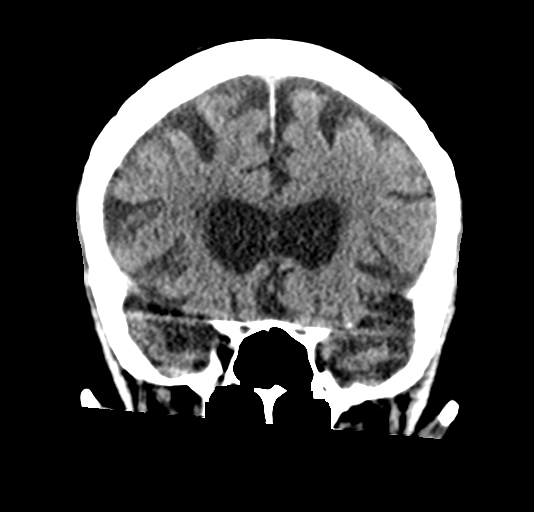
[im 29/65  brain]
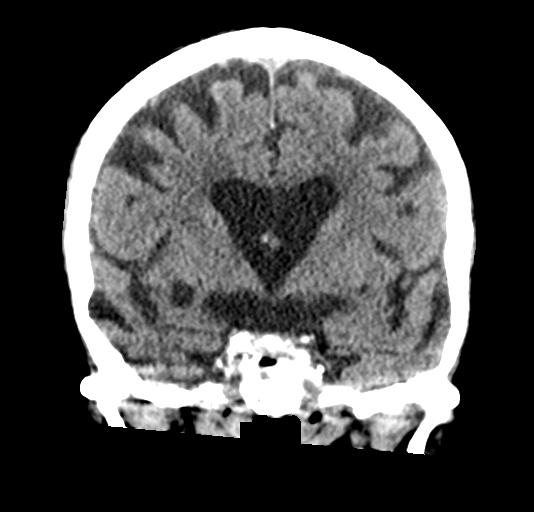
[im 36/65  brain]
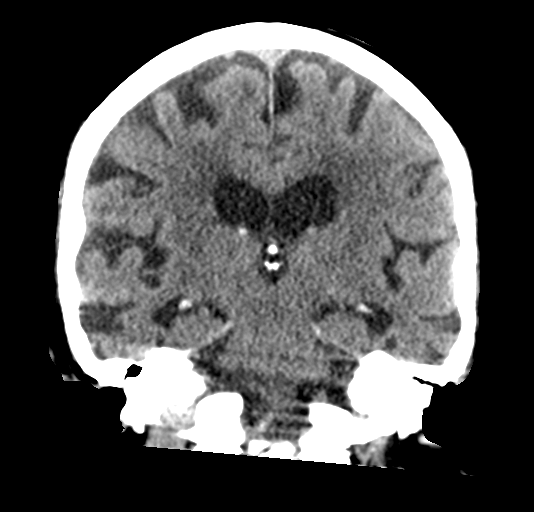

[Series 5: sagittal soft tissue · sagittal · 0.32mm/px · 3 of 51 slices shown]
[im 17/51  brain]
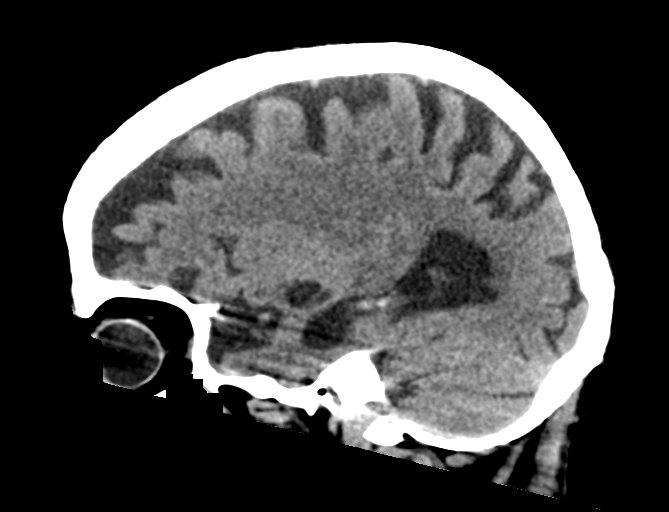
[im 26/51  brain]
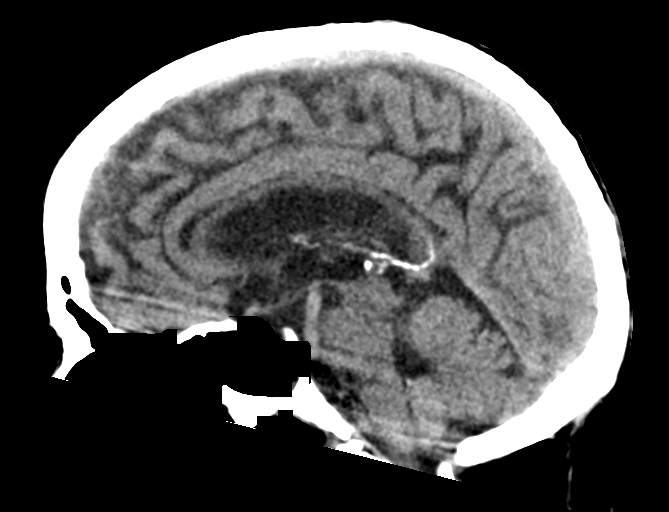
[im 34/51  brain]
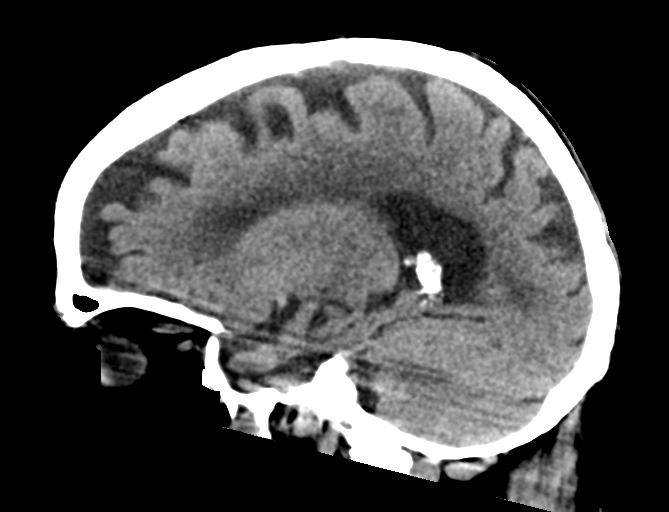

[15 of 46 positions shown; findings below may reference images not displayed]

FINDINGS: Brain: Generalized atrophy and chronic small vessel ischemia, not
significantly changed from prior exam. No intracranial hemorrhage,
mass effect, or midline shift. No hydrocephalus. The basilar
cisterns are patent. There is a remote lacunar infarct in the left
caudate. No evidence of territorial infarct or acute ischemia. No
extra-axial or intracranial fluid collection.

Vascular: Atherosclerosis of skullbase vasculature without
hyperdense vessel or abnormal calcification.

Skull: No fracture or focal lesion.

Sinuses/Orbits: Mucosal thickening in right left side of sphenoid
sinus. Remaining paranasal sinuses are clear. Mastoid air cells are
hypo pneumatized, chronic. Bilateral cataract resection.

Other: None.
IMPRESSION: 1. No acute intracranial abnormality.
2. Minimal sphenoid sinus mucosal thickening.
3. Unchanged atrophy and chronic small vessel ischemia. Remote
lacunar infarct in the left caudate.
